# Patient Record
Sex: Male | Born: 1970 | Race: White | Hispanic: No | Marital: Married | Smoking: Current some day smoker
Health system: Southern US, Community
[De-identification: ages and names within clinical notes are randomized; demographics above are authoritative.]

---

## 2002-06-22 ENCOUNTER — Ambulatory Visit (HOSPITAL_COMMUNITY): Admission: RE | Admit: 2002-06-22 | Discharge: 2002-06-22 | Payer: Self-pay | Admitting: Family Medicine

## 2002-06-22 ENCOUNTER — Encounter: Payer: Self-pay | Admitting: Family Medicine

## 2006-05-07 ENCOUNTER — Emergency Department (HOSPITAL_COMMUNITY): Admission: EM | Admit: 2006-05-07 | Discharge: 2006-05-07 | Payer: Self-pay | Admitting: Emergency Medicine

## 2007-09-13 ENCOUNTER — Emergency Department: Payer: Self-pay | Admitting: Emergency Medicine

## 2014-07-05 ENCOUNTER — Emergency Department (HOSPITAL_COMMUNITY)
Admission: EM | Admit: 2014-07-05 | Discharge: 2014-07-05 | Disposition: A | Discharge status: Home / Self Care | Payer: 59 | Attending: Emergency Medicine | Admitting: Emergency Medicine

## 2014-07-05 ENCOUNTER — Encounter (HOSPITAL_COMMUNITY): Payer: Self-pay | Admitting: Emergency Medicine

## 2014-07-05 DIAGNOSIS — S20221A Contusion of right back wall of thorax, initial encounter: Secondary | ICD-10-CM | POA: Insufficient documentation

## 2014-07-05 DIAGNOSIS — S3992XA Unspecified injury of lower back, initial encounter: Secondary | ICD-10-CM | POA: Diagnosis present

## 2014-07-05 DIAGNOSIS — M545 Low back pain, unspecified: Secondary | ICD-10-CM

## 2014-07-05 DIAGNOSIS — Y9289 Other specified places as the place of occurrence of the external cause: Secondary | ICD-10-CM | POA: Diagnosis not present

## 2014-07-05 DIAGNOSIS — W01198A Fall on same level from slipping, tripping and stumbling with subsequent striking against other object, initial encounter: Secondary | ICD-10-CM | POA: Diagnosis not present

## 2014-07-05 DIAGNOSIS — S199XXA Unspecified injury of neck, initial encounter: Secondary | ICD-10-CM | POA: Diagnosis not present

## 2014-07-05 DIAGNOSIS — Z720 Tobacco use: Secondary | ICD-10-CM | POA: Diagnosis not present

## 2014-07-05 DIAGNOSIS — W19XXXA Unspecified fall, initial encounter: Secondary | ICD-10-CM

## 2014-07-05 DIAGNOSIS — Y9389 Activity, other specified: Secondary | ICD-10-CM | POA: Diagnosis not present

## 2014-07-05 DIAGNOSIS — S3991XA Unspecified injury of abdomen, initial encounter: Secondary | ICD-10-CM | POA: Insufficient documentation

## 2014-07-05 DIAGNOSIS — Y998 Other external cause status: Secondary | ICD-10-CM | POA: Diagnosis not present

## 2014-07-05 LAB — URINALYSIS, ROUTINE W REFLEX MICROSCOPIC
Bilirubin Urine: NEGATIVE
Glucose, UA: NEGATIVE mg/dL
Hgb urine dipstick: NEGATIVE
Ketones, ur: NEGATIVE mg/dL
Leukocytes, UA: NEGATIVE
Nitrite: NEGATIVE
Protein, ur: NEGATIVE mg/dL
Specific Gravity, Urine: 1.019 (ref 1.005–1.030)
Urobilinogen, UA: 0.2 mg/dL (ref 0.0–1.0)
pH: 7 (ref 5.0–8.0)

## 2014-07-05 MED ORDER — METHOCARBAMOL 500 MG PO TABS: 500.0000 mg | ORAL_TABLET | Freq: Two times a day (BID) | ORAL | Status: DC

## 2014-07-05 MED ORDER — HYDROCODONE-ACETAMINOPHEN 5-325 MG PO TABS: 1.0000 | ORAL_TABLET | Freq: Four times a day (QID) | ORAL | Status: DC | PRN

## 2014-07-05 MED ORDER — NAPROXEN 500 MG PO TABS: 500.0000 mg | ORAL_TABLET | Freq: Two times a day (BID) | ORAL | Status: DC

## 2014-07-05 NOTE — Discharge Instructions (Signed)
Take pain medication and muscle relaxer as needed for pain.  Do not drive or operate heavy machinery for 4-6 hours after taking medication. °

## 2014-07-05 NOTE — ED Provider Notes (Signed)
CSN: [id number]     Arrival date & time 07/05/14  0905 History  This chart was scribed for non-physician practitioner, Santiago GladHeather Lorenzo Arscott, PA-C, working with Enid SkeensJoshua M Zavitz, MD by Charline BillsEssence Howell, ED Scribe. This patient was seen in room TR09C/TR09C and the patient's care was started at 10:08 AM.   Chief Complaint  Patient presents with  . Fall  . Flank Pain  . Neck Pain   The history is provided by the patient. No language interpreter was used.   HPI Comments: Dale Higgins is a 43 y.o. male who presents to the Emergency Department complaining of slip and fall that occurred last night. Pt states that slipped in a puddle of water and hit his back on his deck steps upon landing. No LOC. He denies hitting his head. Pt reports secondary R flank pain and back pain that he describes as a sharp sensation with movement and dull sensation at rest. Pt also reports numbness/tingling in his R shoulder that radiated down R arm. Pt reports that numbness lasted for a few minutes and has completely resolved. Pt denies neck pain, numbness/tingling in lower extremities, leg pain, hematuria. Pt has tried ibuprofen without relief.   History reviewed. No pertinent past medical history. History reviewed. No pertinent past surgical history. No family history on file. History  Substance Use Topics  . Smoking status: Current Some Day Smoker  . Smokeless tobacco: Not on file  . Alcohol Use: Yes    Review of Systems  Genitourinary: Positive for flank pain. Negative for hematuria.  Musculoskeletal: Positive for back pain. Negative for neck pain.  Neurological: Negative for syncope and numbness.  All other systems reviewed and are negative.  Allergies  Review of patient's allergies indicates no known allergies.  Home Medications   Prior to Admission medications   Not on File   Triage Vitals: BP 144/86 mmHg  Pulse 83  Temp(Src) 97.9 F (36.6 C) (Oral)  Resp 24  SpO2 99% Physical Exam  Constitutional:  He is oriented to person, place, and time. He appears well-developed and well-nourished. No distress.  HENT:  Head: Normocephalic and atraumatic.  Eyes: Conjunctivae and EOM are normal.  Neck: Neck supple. No tracheal deviation present.  Cardiovascular: Normal rate, regular rhythm and normal heart sounds.   Pulses:      Radial pulses are 2+ on the right side, and 2+ on the left side.       Dorsalis pedis pulses are 2+ on the right side, and 2+ on the left side.  Pulmonary/Chest: Effort normal and breath sounds normal. No respiratory distress.  Musculoskeletal: Normal range of motion.  No tenderness to palpation of the cervical, thoracic or lumbar spine. No stepoff or deformity. Ecchymosis of the R lower thoracic paraspinal area. No hematoma. Full ROM of elbow, shoulder, wrist bilaterally.   Neurological: He is alert and oriented to person, place, and time.  Distal sensation of fingers intact bilaterally. Distal sensation of feet intact bilaterally. Pt able to ambulate without difficulty but stiff with ambulation.  Skin: Skin is warm and dry.  Psychiatric: He has a normal mood and affect. His behavior is normal.  Nursing note and vitals reviewed.  ED Course  Procedures (including critical care time) DIAGNOSTIC STUDIES: Oxygen Saturation is 99% on RA, normal by my interpretation.    COORDINATION OF CARE: 10:15 AM-Discussed treatment plan which includes UA, Robaxin, Naproxen and Vicodin with pt at bedside and pt agreed to plan.   Labs Review Labs Reviewed  URINALYSIS,  ROUTINE W REFLEX MICROSCOPIC   Imaging Review No results found.   EKG Interpretation None      MDM   Final diagnoses:  None   Patient presents with pain and bruising of the right mid back.  Pain has been present since a mechanical fall that occurred yesterday.  No spinal tenderness on exam.  He did not hit head or LOC.  Patient neurovascularly intact.  No microscopic hematuria with UA.  Patient is ambulatory in  the ED.  Feel that the patient is stable for discharge.  Return precautions given.    I personally performed the services described in this documentation, which was scribed in my presence. The recorded information has been reviewed and is accurate.    Santiago GladHeather Daelen Belvedere, PA-C 07/05/14 1116  Enid SkeensJoshua M Zavitz, MD 07/05/14 [phone]

## 2014-07-05 NOTE — ED Notes (Signed)
Slipped and fell on deck steps last pm. C/o neck pain with tingling to right arm. Bruising to right flank area with pain. Denies leg pain or paresthesias.

## 2016-09-07 DIAGNOSIS — Z0000 Encounter for general adult medical examination without abnormal findings: Secondary | ICD-10-CM | POA: Diagnosis not present

## 2016-09-07 DIAGNOSIS — Z23 Encounter for immunization: Secondary | ICD-10-CM | POA: Diagnosis not present

## 2016-09-07 DIAGNOSIS — Z13220 Encounter for screening for lipoid disorders: Secondary | ICD-10-CM | POA: Diagnosis not present

## 2017-06-17 DIAGNOSIS — M654 Radial styloid tenosynovitis [de Quervain]: Secondary | ICD-10-CM | POA: Diagnosis not present

## 2017-07-05 DIAGNOSIS — M1811 Unilateral primary osteoarthritis of first carpometacarpal joint, right hand: Secondary | ICD-10-CM | POA: Diagnosis not present

## 2017-08-16 DIAGNOSIS — M1811 Unilateral primary osteoarthritis of first carpometacarpal joint, right hand: Secondary | ICD-10-CM | POA: Diagnosis not present

## 2018-07-25 DIAGNOSIS — J0100 Acute maxillary sinusitis, unspecified: Secondary | ICD-10-CM | POA: Diagnosis not present

## 2018-07-25 DIAGNOSIS — R05 Cough: Secondary | ICD-10-CM | POA: Diagnosis not present

## 2020-05-20 ENCOUNTER — Other Ambulatory Visit: Payer: Self-pay | Admitting: Internal Medicine

## 2020-05-20 ENCOUNTER — Other Ambulatory Visit: Payer: Self-pay

## 2020-05-20 DIAGNOSIS — R1032 Left lower quadrant pain: Secondary | ICD-10-CM

## 2020-05-23 ENCOUNTER — Other Ambulatory Visit: Payer: Self-pay

## 2020-05-23 ENCOUNTER — Encounter (HOSPITAL_COMMUNITY): Payer: Self-pay

## 2020-05-23 ENCOUNTER — Emergency Department (HOSPITAL_COMMUNITY)
Admission: EM | Admit: 2020-05-23 | Discharge: 2020-05-23 | Disposition: A | Discharge status: Home / Self Care | Payer: PRIVATE HEALTH INSURANCE | Attending: Emergency Medicine | Admitting: Emergency Medicine

## 2020-05-23 ENCOUNTER — Emergency Department (HOSPITAL_COMMUNITY): Payer: PRIVATE HEALTH INSURANCE

## 2020-05-23 DIAGNOSIS — F17200 Nicotine dependence, unspecified, uncomplicated: Secondary | ICD-10-CM | POA: Insufficient documentation

## 2020-05-23 DIAGNOSIS — R1032 Left lower quadrant pain: Secondary | ICD-10-CM | POA: Insufficient documentation

## 2020-05-23 LAB — CBC WITH DIFFERENTIAL/PLATELET
Abs Immature Granulocytes: 0.02 10*3/uL (ref 0.00–0.07)
Basophils Absolute: 0.1 10*3/uL (ref 0.0–0.1)
Basophils Relative: 1 %
Eosinophils Absolute: 0.1 10*3/uL (ref 0.0–0.5)
Eosinophils Relative: 1 %
HCT: 46.3 % (ref 39.0–52.0)
Hemoglobin: 15.7 g/dL (ref 13.0–17.0)
Immature Granulocytes: 0 %
Lymphocytes Relative: 30 %
Lymphs Abs: 1.9 10*3/uL (ref 0.7–4.0)
MCH: 30 pg (ref 26.0–34.0)
MCHC: 33.9 g/dL (ref 30.0–36.0)
MCV: 88.5 fL (ref 80.0–100.0)
Monocytes Absolute: 0.5 10*3/uL (ref 0.1–1.0)
Monocytes Relative: 7 %
Neutro Abs: 4 10*3/uL (ref 1.7–7.7)
Neutrophils Relative %: 61 %
Platelets: 246 10*3/uL (ref 150–400)
RBC: 5.23 MIL/uL (ref 4.22–5.81)
RDW: 12.9 % (ref 11.5–15.5)
WBC: 6.5 10*3/uL (ref 4.0–10.5)
nRBC: 0 % (ref 0.0–0.2)

## 2020-05-23 LAB — COMPREHENSIVE METABOLIC PANEL
ALT: 48 U/L — ABNORMAL HIGH (ref 0–44)
AST: 27 U/L (ref 15–41)
Albumin: 4 g/dL (ref 3.5–5.0)
Alkaline Phosphatase: 50 U/L (ref 38–126)
Anion gap: 8 (ref 5–15)
BUN: 13 mg/dL (ref 6–20)
CO2: 23 mmol/L (ref 22–32)
Calcium: 8.6 mg/dL — ABNORMAL LOW (ref 8.9–10.3)
Chloride: 106 mmol/L (ref 98–111)
Creatinine, Ser: 1.11 mg/dL (ref 0.61–1.24)
GFR, Estimated: >60 mL/min (ref >60)
Glucose, Bld: 121 mg/dL — ABNORMAL HIGH (ref 70–99)
Potassium: 4.7 mmol/L (ref 3.5–5.1)
Sodium: 137 mmol/L (ref 135–145)
Total Bilirubin: 0.6 mg/dL (ref 0.3–1.2)
Total Protein: 7.1 g/dL (ref 6.5–8.1)

## 2020-05-23 LAB — URINALYSIS, ROUTINE W REFLEX MICROSCOPIC
Bilirubin Urine: NEGATIVE
Glucose, UA: NEGATIVE mg/dL
Hgb urine dipstick: NEGATIVE
Ketones, ur: NEGATIVE mg/dL
Leukocytes,Ua: NEGATIVE
Nitrite: NEGATIVE
Protein, ur: NEGATIVE mg/dL
Specific Gravity, Urine: 1.018 (ref 1.005–1.030)
pH: 6 (ref 5.0–8.0)

## 2020-05-23 LAB — LIPASE, BLOOD: Lipase: 36 U/L (ref 11–51)

## 2020-05-23 MED ORDER — IOHEXOL 300 MG/ML  SOLN
100.0000 mL | Freq: Once | INTRAMUSCULAR | Status: AC | PRN
  Administered 2020-05-23: 100 mL via INTRAVENOUS

## 2020-05-23 MED ORDER — DICYCLOMINE HCL 20 MG PO TABS
20.0000 mg | ORAL_TABLET | Freq: Every day | ORAL | 0 refills | Status: AC | PRN
Start: 2020-05-23 — End: 2020-06-02

## 2020-05-23 MED ORDER — SODIUM CHLORIDE (PF) 0.9 % IJ SOLN
INTRAMUSCULAR | Status: AC
  Filled 2020-05-23: qty 50

## 2020-05-23 NOTE — Discharge Instructions (Signed)
Try taking motrin 600 mg every 6 hours for the next 5 days for your abdominal pain.  It may be a pinched nerve or a bowel spasm or muscle strain.  Our CT scan and bloodwork today did not show any sign of serious infection or emergency.

## 2020-05-23 NOTE — ED Triage Notes (Signed)
Pt presents with c/o abdominal pain for 3 weeks with diarrhea. Pt denies any N/V. Pt reports he has been seen at his PCP as he has a hx of diverticulitis. Pt reports he has been on abx prescribed from his PCP.

## 2020-05-23 NOTE — ED Provider Notes (Signed)
[facility]-EMERGENCY DEPT Provider Note   CSN: [id number] Arrival date & time: 05/23/20  8657     History Chief Complaint  Patient presents with  . Abdominal Pain    Dale Higgins is a 49 y.o. male with no significant past medical history presenting to emergency department with complaint of left lower quadrant abdominal pain.  Patient reports is been ongoing for 3 weeks.  He has had approximately 3 loose, nonbloody bowel movements per day.  He says his PCP has prescribed him ciprofloxacin and metronidazole for 2 weeks now, with his last dose today.  He has been taking his medication as prescribed, feels like his subjective fevers have improved, but he continues having severe cramping lower abdominal pain and loose bowel movements.  He says his pain comes and goes.  It is a stabbing pain in his left lower quadrant.  Sometimes it feels like a cramp.  It is currently an 8 out of 10.  Nothing makes it better or worse.  He has never had this kind of pain before.  He denies any nausea or vomiting has been able to eat and keep down fluids well.  He denies any dysuria.  He reports he did receive the Covid vaccines.  His PCP had scheduled him for a CT scan of the abdomen 1 week from now.  He reports prior history of diverticulitis, 2 episodes in the past.  He had a colonoscopy over 10 years ago and was told he has diverticulosis, but does not recall any other abnormal findings.  He denies any history of Crohn's disease or IBS.  No abd surgical hx NKDA No medications at baseline    HPI     History reviewed. No pertinent past medical history.  There are no problems to display for this patient.   History reviewed. No pertinent surgical history.     History reviewed. No pertinent family history.  Social History   Tobacco Use  . Smoking status: Current Some Day Smoker  Substance Use Topics  . Alcohol use: Yes  . Drug use: No    Home Medications Prior to  Admission medications   Medication Sig Start Date End Date Taking? Authorizing Provider  ciprofloxacin (CIPRO) 500 MG tablet Take 500 mg by mouth 2 (two) times daily. x7ds 05/12/20  Yes [provider]  dicyclomine (BENTYL) 20 MG tablet Take 1 tablet (20 mg total) by mouth daily as needed for up to 10 days for spasms. 05/23/20 06/02/20  Terald Sleeper, MD    Allergies    Patient has no known allergies.  Review of Systems   Review of Systems  Constitutional: Negative for appetite change, chills and fever.  HENT: Negative for ear pain and sore throat.   Eyes: Negative for pain and visual disturbance.  Respiratory: Negative for cough and shortness of breath.   Cardiovascular: Negative for chest pain and palpitations.  Gastrointestinal: Positive for abdominal pain and diarrhea. Negative for blood in stool, nausea and vomiting.  Genitourinary: Negative for dysuria and hematuria.  Musculoskeletal: Negative for arthralgias and back pain.  Skin: Negative for color change and rash.  Neurological: Negative for syncope, light-headedness and headaches.  Psychiatric/Behavioral: Negative for agitation and confusion.  All other systems reviewed and are negative.   Physical Exam Updated Vital Signs BP (!) 138/95   Pulse 77   Temp 98.9 F (37.2 C) (Oral)   Resp 18   SpO2 98%   Physical Exam Vitals and nursing note reviewed.  Constitutional:      Appearance: He is well-developed.  HENT:     Head: Normocephalic and atraumatic.  Eyes:     Conjunctiva/sclera: Conjunctivae normal.  Cardiovascular:     Rate and Rhythm: Normal rate and regular rhythm.     Heart sounds: Normal heart sounds.  Pulmonary:     Effort: Pulmonary effort is normal. No respiratory distress.     Breath sounds: Normal breath sounds.  Abdominal:     Palpations: Abdomen is soft.     Tenderness: There is abdominal tenderness in the left lower quadrant. There is no guarding or rebound. Negative signs include  Murphy's sign and McBurney's sign.  Genitourinary:    Comments: GU exam with no testicular or epididymal tenderness, no inguinal hernia palpable Musculoskeletal:     Cervical back: Neck supple.  Skin:    General: Skin is warm and dry.  Neurological:     General: No focal deficit present.     Mental Status: He is alert and oriented to person, place, and time.  Psychiatric:        Mood and Affect: Mood normal.        Behavior: Behavior normal.     ED Results / Procedures / Treatments   Labs (all labs ordered are listed, but only abnormal results are displayed) Labs Reviewed  COMPREHENSIVE METABOLIC PANEL - Abnormal; Notable for the following components:      Result Value   Glucose, Bld 121 (*)    Calcium 8.6 (*)    ALT 48 (*)    All other components within normal limits  LIPASE, BLOOD  URINALYSIS, ROUTINE W REFLEX MICROSCOPIC  CBC WITH DIFFERENTIAL/PLATELET    EKG None  Radiology CT ABDOMEN PELVIS W CONTRAST  Result Date: 05/23/2020 CLINICAL DATA:  Diverticulitis suspected. Persistent left lower quadrant pain and tenderness. EXAM: CT ABDOMEN AND PELVIS WITH CONTRAST TECHNIQUE: Multidetector CT imaging of the abdomen and pelvis was performed using the standard protocol following bolus administration of intravenous contrast. CONTRAST:  OMNIPAQUE IOHEXOL 300 MG/ML  SOLN COMPARISON:  09/13/07. FINDINGS: Lower chest: No acute abnormality. Hepatobiliary: There are several subcentimeter low-density foci within the liver, too small to reliably characterize. No suspicious liver abnormality. Multiple small gallstones identified measuring up to 3 mm. No gallbladder wall thickening or pericholecystic inflammation. No bile duct dilatation or signs of choledocholithiasis. Pancreas: Unremarkable. No pancreatic ductal dilatation or surrounding inflammatory changes. Spleen: Normal in size without focal abnormality. Adrenals/Urinary Tract: Normal appearance of the adrenal glands. There is no  kidney mass or hydronephrosis identified. Urinary bladder unremarkable. Stomach/Bowel: The stomach appears normal. No dilated loops of small or large bowel. The appendix is visualized and appears within normal limits. A few scattered colonic diverticula are noted. No signs of acute diverticulitis. Vascular/Lymphatic: No significant vascular findings are present. No enlarged abdominal or pelvic lymph nodes. Reproductive: Prostate is unremarkable. Other: No free fluid or fluid collections. Musculoskeletal: No acute or significant osseous findings. IMPRESSION: 1. No acute findings within the abdomen or pelvis. No evidence for acute diverticulitis. 2. Gallstones. Electronically Signed   By: Signa Kell M.D.   On: 05/23/2020 10:13    Procedures Procedures (including critical care time)  Medications Ordered in ED Medications  iohexol (OMNIPAQUE) 300 MG/ML solution 100 mL (100 mLs Intravenous Contrast Given 05/23/20 0951)    ED Course  I have reviewed the triage vital signs and the nursing notes.  Pertinent labs & imaging results that were available during my care of the patient  were reviewed by me and considered in my medical decision making (see chart for details).  This patient presents to the Emergency Department with complaint of abdominal pain. This involves an extensive number of treatment options, and is a complaint that carries with it a high risk of complications and morbidity.  The differential diagnosis includes diverticulitis vs constipation vs inflammatory bowel disease vs intraabdominal infection vs other  No signs of testicular involvement on exam - doubt torsion or epididymitis  I ordered, reviewed, and interpreted labs, including CMP, CBC, Lipase, and UA. There were no immediate, life-threatening emergencies found in this labwork.  Patient's UA showed no signs of infection I ordered imaging studies which included CT abdomen pelvis with IV contrast  I independently visualized and  interpreted imaging which showed no acute findings to explain his symptoms and the monitor tracing which showed NSR   No fever or leukocytosis - doubt bacterial colitis or infection.  I would not continue antibiotics further at this point (he's completed 2 weeks).    After the interventions stated above, I reevaluated the patient and found that the patient remained clinically stable.  Based on the patient's clinical exam, vital signs, risk factors, and ED testing, I felt that the patient's overall risk of life-threatening emergency such as bowel perforation, surgical emergency, or sepsis was quite low.  I suspect this clinical presentation is most consistent with a peripheral neuropathy or muscular injury, but explained to the patient that this evaluation was not a definitive diagnostic workup.  I discussed outpatient follow up with primary care provider, and provided specialist office number on the patient's discharge paper if a referral was deemed necessary.  I discussed return precautions with the patient. I felt the patient was clinically stable for discharge.   Clinical Course as of May 23 1414  Fri May 23, 2020  1055 No acute findings on CT scan.  UA negative for infection.     [MT]  1156 Reviewed w/u with pt and wife.  I cannot find an emergent cause for his abdominal pain.  May be a pinched nerve or muscle tear.  Okay for discharge.   [MT]    Clinical Course User Index [MT] Amaia Lavallie, Kermit Balo, MD    Final Clinical Impression(s) / ED Diagnoses Final diagnoses:  Left lower quadrant abdominal pain    Rx / DC Orders ED Discharge Orders         Ordered    dicyclomine (BENTYL) 20 MG tablet  Daily PRN        05/23/20 1158           Terald Sleeper, MD 05/23/20 1416

## 2020-05-29 ENCOUNTER — Encounter (HOSPITAL_COMMUNITY): Payer: Self-pay

## 2020-05-29 ENCOUNTER — Ambulatory Visit (HOSPITAL_COMMUNITY): Payer: PRIVATE HEALTH INSURANCE

## 2020-05-30 ENCOUNTER — Ambulatory Visit (HOSPITAL_COMMUNITY)
Admission: RE | Admit: 2020-05-30 | Discharge: 2020-05-30 | Disposition: A | Discharge status: Home / Self Care | Payer: PRIVATE HEALTH INSURANCE | Source: Ambulatory Visit | Attending: Family | Admitting: Family

## 2020-05-30 ENCOUNTER — Other Ambulatory Visit: Payer: Self-pay

## 2020-05-30 ENCOUNTER — Other Ambulatory Visit (HOSPITAL_COMMUNITY): Payer: Self-pay | Admitting: Family

## 2020-05-30 DIAGNOSIS — M5430 Sciatica, unspecified side: Secondary | ICD-10-CM

## 2021-09-01 IMAGING — DX DG LUMBAR SPINE COMPLETE 4+V
5 series · 5 of 5 positions shown · non-contrast
Comparison: CT dated May 23, 2020

CLINICAL DATA: Sciatica

EXAM:
LUMBAR SPINE - COMPLETE 4+ VIEW

[l-spine ap]
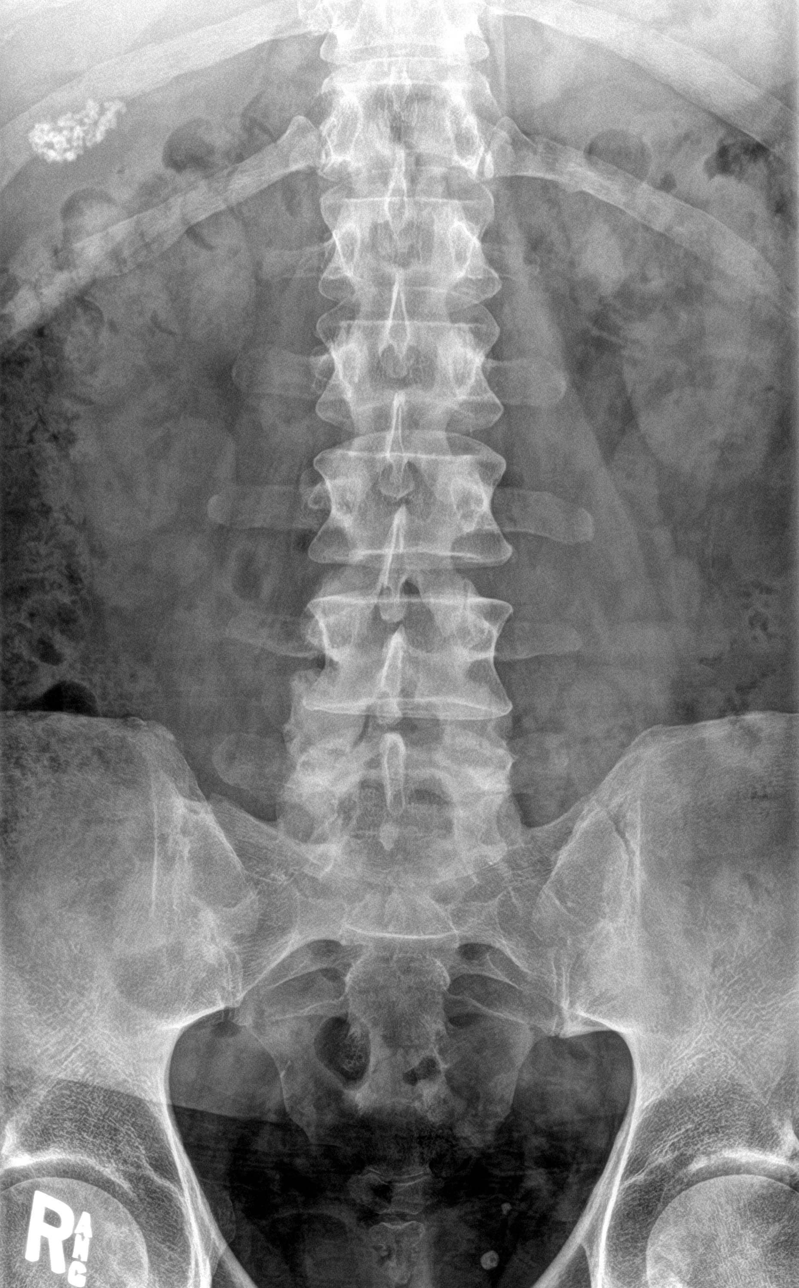

[l-spine obl (1 of 2)]
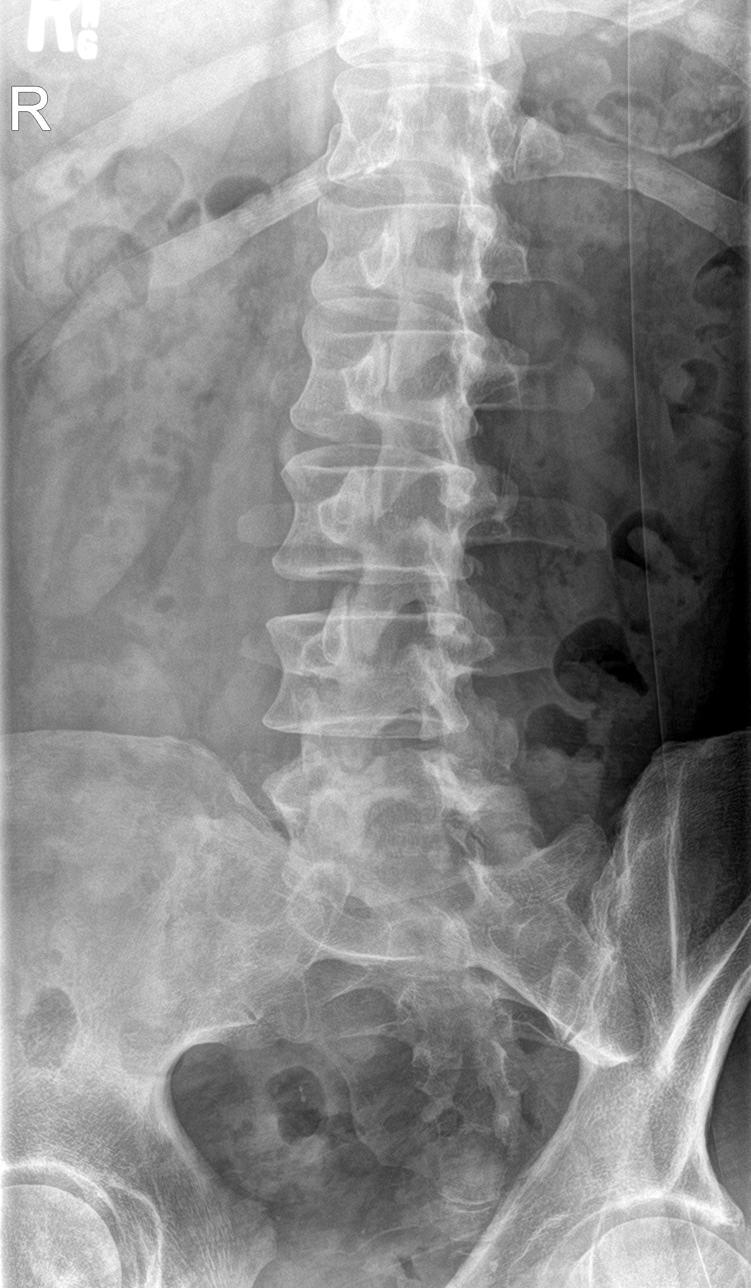

[l-spine obl (2 of 2)]
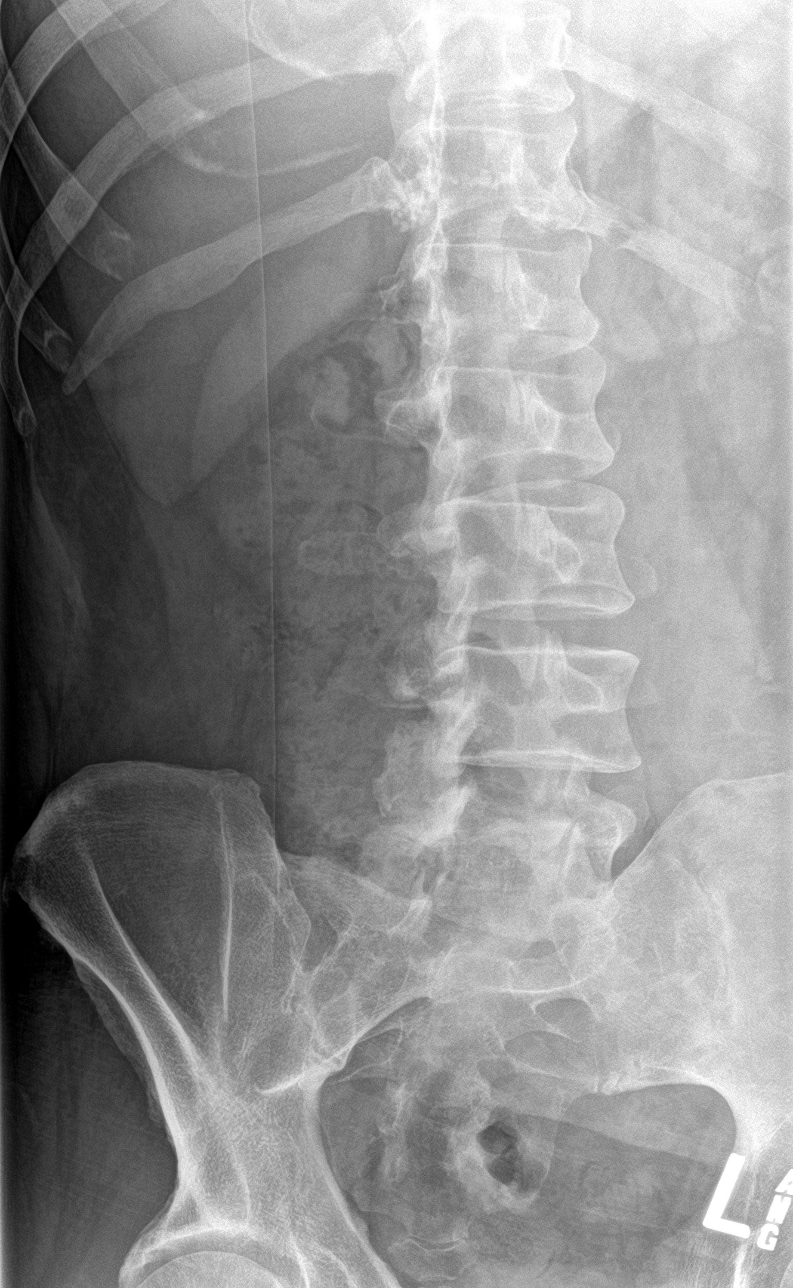

[l-spine lat]
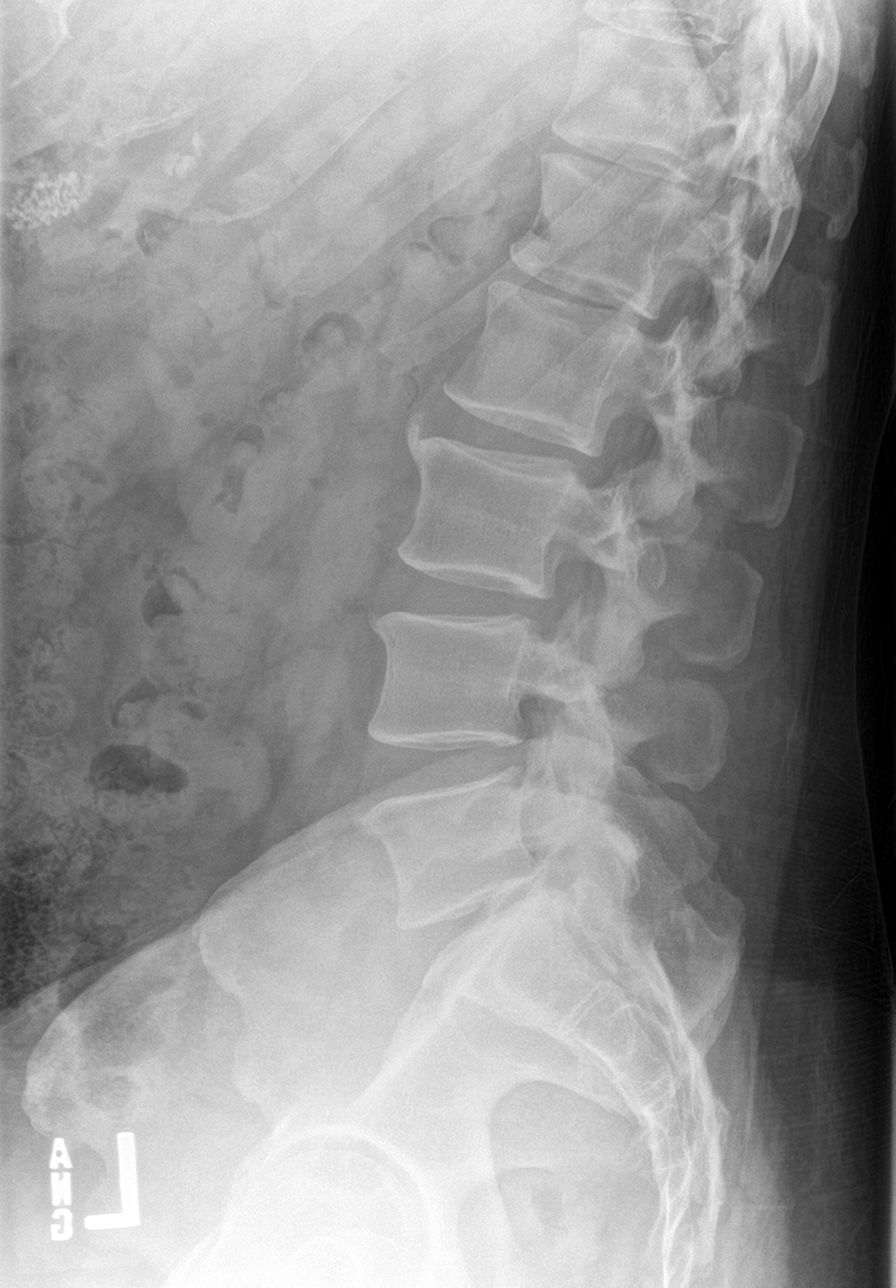

[l-spine spot]
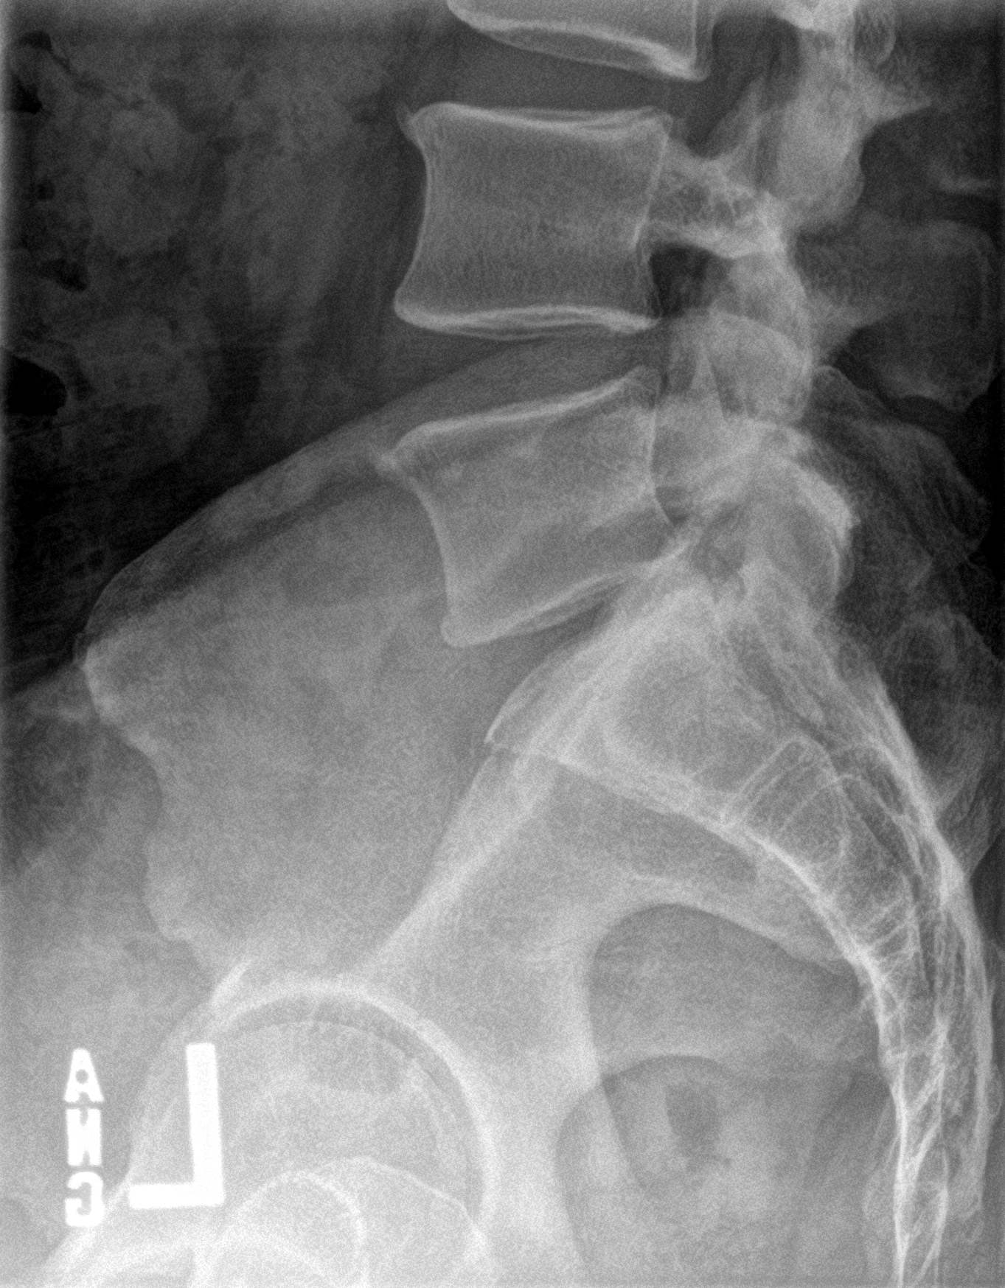

[5 of 5 positions shown; findings below may reference images not displayed]

FINDINGS: There are gallstones. There are minimal degenerative changes of the
lumbar spine. There is mild facet arthrosis in the lower lumbar
segments. There is no acute compression fracture. There is no
significant malalignment.
IMPRESSION: 1. Mild degenerative changes of the lumbar spine without acute
osseous abnormality.
2. Cholelithiasis.
